# Patient Record
Sex: Male | Born: 1990 | Race: White | Hispanic: No | Marital: Married
Health system: Southern US, Community
[De-identification: ages and names within clinical notes are randomized; demographics above are authoritative.]

## PROBLEM LIST (undated history)

## (undated) DIAGNOSIS — I319 Disease of pericardium, unspecified: Secondary | ICD-10-CM

---

## 2009-09-12 ENCOUNTER — Encounter: Payer: Self-pay | Admitting: Internal Medicine

## 2009-09-12 ENCOUNTER — Observation Stay (HOSPITAL_COMMUNITY): Admission: EM | Admit: 2009-09-12 | Discharge: 2009-09-14 | Payer: Self-pay | Admitting: Emergency Medicine

## 2009-09-12 ENCOUNTER — Ambulatory Visit: Payer: Self-pay | Admitting: Internal Medicine

## 2009-09-24 ENCOUNTER — Encounter: Payer: Self-pay | Admitting: Internal Medicine

## 2009-10-20 ENCOUNTER — Ambulatory Visit: Payer: Self-pay | Admitting: Internal Medicine

## 2009-10-20 DIAGNOSIS — I319 Disease of pericardium, unspecified: Secondary | ICD-10-CM | POA: Insufficient documentation

## 2009-12-24 ENCOUNTER — Ambulatory Visit: Payer: Self-pay | Admitting: Internal Medicine

## 2010-04-21 NOTE — Assessment & Plan Note (Signed)
Summary: 2 [email or web address]   Visit Type:  Follow-up Primary Provider:  None  CC:  Pt continues to have problems with his throat otherwise he is doing ok.  History of Present Illness: [name] returns today for followup.   He is a pleasant 20 yo man with a h/io acute pericarditis diagnosed back in June 2011.  He was placed on ibuprofen and colchicine and returns today for followup.  I saw him several months ago and he had prematurely stopped his meds and his symptoms returned.  He has been back on his meds and has pain chest pain free.  He does note the sensation of a sore throat and pain when he swallows, particularly with spicey foods or soda's.  No other complaints.  Allergies: No Known Drug Allergies  Past History:  Past Medical History: Last updated: 10/20/2009 Current Problems:  ? GASTRITIS (ICD-535.50) PERICARDITIS (ICD-423.9)    Review of Systems  The patient denies hoarseness.    Vital Signs:  Patient profile:   20 year old male Height:      69 inches Weight:      160 pounds BMI:     23.71 Pulse rate:   76 / minute BP sitting:   126 / 67  (left arm) Cuff size:   regular  Vitals Entered By: Burnett Kanaris, CNA (December 24, 2009 11:23 AM)  Physical Exam  General:  Well developed, well nourished, in no acute distress.  HEENT: normal Neck: supple. No JVD. Carotids 2+ bilaterally no bruits Cor: RRR no gallops or murmur. No rubs are present today. Lungs: CTA Ab: soft, nontender. nondistended. No HSM. Good bowel sounds Ext: warm. no cyanosis, clubbing or edema Neuro: alert and oriented. Grossly nonfocal. affect pleasant    Impression & Recommendations:  Problem # 1:  PERICARDITIS (ICD-423.9) His symptoms have resolved.  I have asked him to stop his Ibuprofen today and continue colchicine.  I wll see him back in several months.  He is instructed to call if he has recurrent chest pain. His updated medication list for this problem includes:    Ibuprofen 200 Mg  Tabs (Ibuprofen) .Marland Kitchen... 2 tablets once daily  Problem # 2:  ? GASTRITIS (ICD-535.50) It is unclear if he has gastritis.  He will continue omeprazole and I suspect his symptoms are related to ibuprofen.  We have now stopped this medication.  Will followup. If his gastritis symptoms of sore throat and dysphagia do not resolve in a month, I will refer him to either ENT or GI.  Other Orders: EKG w/ Interpretation (93000)  Patient Instructions: 1)  Your physician recommends that you schedule a follow-up appointment in: 6 months 2)  Your physician has recommended you make the following change in your medication: Stop Ibuprofen.  Prescriptions: COLCRYS 0.6 MG TABS (COLCHICINE) Take one tablet by mouth twice daily.  #90 x 1   Entered by:   Claris Gladden RN   Authorized by:   Laren Boom, MD, [facility]   Signed by:   Claris Gladden RN on 12/24/2009   Method used:   Electronically to        [facility] [phone]* (retail)       [address]       [place], [place]  [address]       Ph: [phone]       Fax: [phone]   RxID:   [id number]

## 2010-04-21 NOTE — Assessment & Plan Note (Signed)
Summary: 4-6 week hospital follow up/mt   Visit Type:  Initial Consult Primary Provider:  None   History of Present Illness: Tony Aguilar returns today for followup.  He is a pleasant 20 yo man with acute pericarditis diagnosed several weeks ago.  He was started on his motrin and colchicine and improved.  He had done well until several days ago when he stopped taking his meds and has noted some recurrent symptoms which are not as bad as prior.  He has had no fever/chills/or sob.  Current Medications (verified): 1)  Colcrys 0.6 Mg Tabs (Colchicine) .... Take One Tablet By Mouth Twice Daily. 2)  Prilosec Otc 20 Mg Tbec (Omeprazole Magnesium) .... Once Daily 3)  Ibuprofen 200 Mg Tabs (Ibuprofen) .... 2 Tablets Once Daily  Allergies (verified): No Known Drug Allergies  Past History:  Past Medical History: Last updated: 10/20/2009 Current Problems:  ? GASTRITIS (ICD-535.50) PERICARDITIS (ICD-423.9)    Family History: Last updated: 10/20/2009  Mother in good health.  Father with diabetes.  He has   brothers and sisters in good health.   Social History: Last updated: 10/20/2009  He lives in [place] with his mother.  He is a Teaching laboratory technician.  He is single with no children.  He does   not smoke.  He drinks occasionally.  He is negative for drug use.      Review of Systems       All systems reviewed and negative except as noted above.  Vital Signs:  Patient profile:   20 year old male Weight:      162 pounds Pulse rate:   63 / minute BP sitting:   120 / 82  (left arm)  Vitals Entered By: Laurance Flatten CMA (October 20, 2009 1:57 PM)  Physical Exam  General:  Well developed, well nourished, in no acute distress.  HEENT: normal Neck: supple. No JVD. Carotids 2+ bilaterally no bruits Cor: RRR no gallops or murmur. A soft friction rub is present (2 component) at the apex. Lungs: CTA Ab: soft, nontender. nondistended. No HSM. Good bowel sounds Ext: warm.  no cyanosis, clubbing or edema Neuro: alert and oriented. Grossly nonfocal. affect pleasant    Impression & Recommendations:  Problem # 1:  PERICARDITIS (ICD-423.9) I have asked him to restart his ibuprofen and colchicine.  I plan to continue his meds for another two months and continue colchicine after this. His updated medication list for this problem includes:    Ibuprofen 200 Mg Tabs (Ibuprofen) .Marland Kitchen... 2 tablets once daily  Patient Instructions: 1)  Your physician recommends that you schedule a follow-up appointment in: 2 months. 2)  Your physician recommends that you continue on your current medications as directed. Please refer to the Current Medication list given to you today.

## 2010-04-21 NOTE — Letter (Signed)
Summary: [facility]   Imported By: Marylou Mccoy 11/17/2009 10:23:43  _____________________________________________________________________  External Attachment:    Type:   Image     Comment:   External Document

## 2010-06-07 LAB — CBC
HCT: 43.2 % (ref 39.0–52.0)
Hemoglobin: 14.7 g/dL (ref 13.0–17.0)
MCH: 32.1 pg (ref 26.0–34.0)
MCV: 94.6 fL (ref 78.0–100.0)
RDW: 12.4 % (ref 11.5–15.5)

## 2010-06-07 LAB — URINALYSIS, ROUTINE W REFLEX MICROSCOPIC
Bilirubin Urine: NEGATIVE
Glucose, UA: NEGATIVE mg/dL
Protein, ur: NEGATIVE mg/dL
Specific Gravity, Urine: 1.01 (ref 1.005–1.030)
Urobilinogen, UA: 1 mg/dL (ref 0.0–1.0)
pH: 6 (ref 5.0–8.0)

## 2010-06-07 LAB — DIFFERENTIAL
Basophils Absolute: 0 10*3/uL (ref 0.0–0.1)
Basophils Relative: 0 % (ref 0–1)
Eosinophils Relative: 1 % (ref 0–5)
Lymphocytes Relative: 8 % — ABNORMAL LOW (ref 12–46)
Monocytes Absolute: 1.7 10*3/uL — ABNORMAL HIGH (ref 0.1–1.0)
Neutro Abs: 8.9 10*3/uL — ABNORMAL HIGH (ref 1.7–7.7)

## 2010-06-07 LAB — BASIC METABOLIC PANEL
BUN: 7 mg/dL (ref 6–23)
CO2: 29 mEq/L (ref 19–32)
Calcium: 9.1 mg/dL (ref 8.4–10.5)
Chloride: 104 mEq/L (ref 96–112)
Chloride: 108 mEq/L (ref 96–112)
Creatinine, Ser: 0.9 mg/dL (ref 0.4–1.5)
GFR calc Af Amer: >60 mL/min (ref >60)
GFR calc non Af Amer: >60 mL/min (ref >60)
Glucose, Bld: 95 mg/dL (ref 70–99)
Glucose, Bld: 98 mg/dL (ref 70–99)
Sodium: 144 mEq/L (ref 135–145)

## 2010-06-07 LAB — URINE MICROSCOPIC-ADD ON

## 2010-06-07 LAB — POCT CARDIAC MARKERS: CKMB, poc: 18.1 ng/mL (ref 1.0–8.0)

## 2010-06-07 LAB — RAPID URINE DRUG SCREEN, HOSP PERFORMED
Benzodiazepines: NOT DETECTED
Cocaine: NOT DETECTED
Tetrahydrocannabinol: NOT DETECTED

## 2010-06-07 LAB — CARDIAC PANEL(CRET KIN+CKTOT+MB+TROPI)
CK, MB: 11 ng/mL (ref 0.3–4.0)
Relative Index: 4.7 — ABNORMAL HIGH (ref 0.0–2.5)
Relative Index: 5.4 — ABNORMAL HIGH (ref 0.0–2.5)
Total CK: 144 U/L (ref 7–232)
Troponin I: 1.53 ng/mL (ref 0.00–0.06)

## 2010-06-07 LAB — CK TOTAL AND CKMB (NOT AT ARMC): Relative Index: 8.9 — ABNORMAL HIGH (ref 0.0–2.5)

## 2015-08-13 ENCOUNTER — Encounter (HOSPITAL_COMMUNITY): Payer: Self-pay | Admitting: Adult Health

## 2015-08-13 ENCOUNTER — Emergency Department (HOSPITAL_COMMUNITY)
Admission: EM | Admit: 2015-08-13 | Discharge: 2015-08-13 | Disposition: A | Discharge status: Home / Self Care | Payer: 59 | Attending: Emergency Medicine | Admitting: Emergency Medicine

## 2015-08-13 ENCOUNTER — Emergency Department (HOSPITAL_COMMUNITY): Payer: 59

## 2015-08-13 DIAGNOSIS — Z8679 Personal history of other diseases of the circulatory system: Secondary | ICD-10-CM | POA: Diagnosis not present

## 2015-08-13 DIAGNOSIS — R0602 Shortness of breath: Secondary | ICD-10-CM | POA: Diagnosis present

## 2015-08-13 DIAGNOSIS — R200 Anesthesia of skin: Secondary | ICD-10-CM | POA: Diagnosis not present

## 2015-08-13 DIAGNOSIS — R55 Syncope and collapse: Secondary | ICD-10-CM | POA: Insufficient documentation

## 2015-08-13 DIAGNOSIS — J069 Acute upper respiratory infection, unspecified: Secondary | ICD-10-CM | POA: Diagnosis not present

## 2015-08-13 HISTORY — DX: Disease of pericardium, unspecified: I31.9

## 2015-08-13 LAB — CBC
HCT: 41.1 % (ref 39.0–52.0)
HEMOGLOBIN: 14 g/dL (ref 13.0–17.0)
MCH: 31.3 pg (ref 26.0–34.0)
MCHC: 34.1 g/dL (ref 30.0–36.0)
MCV: 91.7 fL (ref 78.0–100.0)
Platelets: 291 10*3/uL (ref 150–400)
RBC: 4.48 MIL/uL (ref 4.22–5.81)
RDW: 12.4 % (ref 11.5–15.5)
WBC: 9.9 10*3/uL (ref 4.0–10.5)

## 2015-08-13 LAB — BASIC METABOLIC PANEL
ANION GAP: 8 (ref 5–15)
BUN: 7 mg/dL (ref 6–20)
CALCIUM: 9.4 mg/dL (ref 8.9–10.3)
CHLORIDE: 102 mmol/L (ref 101–111)
CO2: 28 mmol/L (ref 22–32)
Creatinine, Ser: 0.9 mg/dL (ref 0.61–1.24)
GFR calc non Af Amer: >60 mL/min (ref >60)
Glucose, Bld: 93 mg/dL (ref 65–99)
Potassium: 3.8 mmol/L (ref 3.5–5.1)
SODIUM: 138 mmol/L (ref 135–145)

## 2015-08-13 MED ORDER — AZITHROMYCIN 250 MG PO TABS: 250.0000 mg | ORAL_TABLET | Freq: Every day | ORAL | Status: AC

## 2015-08-13 MED ORDER — AZITHROMYCIN 250 MG PO TABS
500.0000 mg | ORAL_TABLET | Freq: Once | ORAL | Status: AC
  Administered 2015-08-13: 500 mg via ORAL
  Filled 2015-08-13: qty 2

## 2015-08-13 MED ORDER — AZITHROMYCIN 250 MG PO TABS: 250.0000 mg | ORAL_TABLET | Freq: Every day | ORAL | Status: DC

## 2015-08-13 NOTE — ED Notes (Signed)
Presents with onset of "I was at work and I was lifting something and felt kind of SOB, I took a break and about 15 minutes later I felt like I was light headed and both my arms went numb below my elbows" pt denies pain, alert and orietend at this time. SOB is resolved.

## 2015-08-13 NOTE — ED Provider Notes (Signed)
CSN: [id number]     Arrival date & time 08/13/15  0116 History   First MD Initiated Contact with Patient 08/13/15 0327     Chief Complaint  Patient presents with  . Near Syncope     (Consider location/radiation/quality/duration/timing/severity/associated sxs/prior Treatment) HPI   Patient to the ER with PMH of pericarditis with complaints of shortness of breath and having a limited sensation of numbness to bilateral arms from the elbow down.   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 He did not eat or drink less than usual and did not do more physical activity than he is used to. He started to get winded and then 15 minutes later reports feeling lightheaded, his arms felt numb. He at no time had any pain. The shortness of breath resolved on its own. His vital signs are stable.  He says that he has been sick the past two months with cough, nasal congestion, runny nose and not feeling well. He has not yet been seen or treated for these symptoms.  Past Medical History  Diagnosis Date  . Pericarditis    History reviewed. No pertinent past surgical history. History reviewed. No pertinent family history. Social History  Substance Use Topics  . Smoking status: Never Smoker   . Smokeless tobacco: None  . Alcohol Use: Yes    Review of Systems  Review of Systems All other systems negative except as documented in the HPI. All pertinent positives and negatives as reviewed in the HPI.   Allergies  Review of patient's allergies indicates no known allergies.  Home Medications   Prior to Admission medications   Medication Sig Start Date End Date Taking? Authorizing Provider  guaiFENesin (MUCINEX) 600 MG 12 hr tablet Take 600 mg by mouth 2 (two) times daily as needed for cough.   Yes Historical Provider, MD  azithromycin (ZITHROMAX) 250 MG tablet Take 1 tablet (250 mg total) by mouth daily. Take first 2 tablets together, then 1 every day until finished.  08/13/15   Gates Jividen Neva SeatGreene, PA-C   BP 118/79 mmHg  Pulse 61  Temp(Src) 97.5 F (36.4 C) (Oral)  Resp 12  SpO2 100% Physical Exam  Constitutional: He appears well-developed and well-nourished. No distress.  HENT:  Head: Normocephalic and atraumatic.  Right Ear: Tympanic membrane and ear canal normal.  Left Ear: Tympanic membrane and ear canal normal.  Nose: Nose normal.  Mouth/Throat: Uvula is midline, oropharynx is clear and moist and mucous membranes are normal.  Eyes: Pupils are equal, round, and reactive to light.  Neck: Normal range of motion. Neck supple.  Cardiovascular: Normal rate and regular rhythm.   Pulmonary/Chest: Effort normal.  Abdominal: Soft.  No signs of abdominal distention  Musculoskeletal:  No LE swelling Physiologic grip strength and sensations intact Radial pulses are symmetrical   Neurological: He is alert.  Acting at baseline  Skin: Skin is warm and dry. No rash noted.  Nursing note and vitals reviewed.   ED Course  Procedures (including critical care time) Labs Review Labs Reviewed  BASIC METABOLIC PANEL  CBC    Imaging Review Dg Chest 2 View  08/13/2015  CLINICAL DATA:  Initial evaluation for acute cough, shortness of breath, fatigue. Numbness in both arms. EXAM: CHEST  2 VIEW COMPARISON:  Prior study from 09/12/2009. FINDINGS: The cardiac and mediastinal silhouettes are stable in size and contour, and remain within normal limits. The lungs are normally inflated. No airspace consolidation, pleural effusion, or pulmonary  edema is identified. There is no pneumothorax. No acute osseous abnormality identified. IMPRESSION: No active cardiopulmonary disease. Electronically Signed   By: Rise Mu M.D.   On: 08/13/2015 04:41   I have personally reviewed and evaluated these images and lab results as part of my medical decision-making.  MANLY, NESTLE [id number] 13-Aug-2015 01:27:10 [facility]/ED ROUTINE RECORD Normal sinus  rhythm with sinus arrhythmia Normal ECG 64mm/s 54mm/mV  8.0 SP2 12SL 237 CID: 6 Referred by: Unconfirmed Vent. rate 77 BPM PR interval 132 ms QRS duration 86 ms QT/QTc 390/441 ms P-R-T axes 71 74 59    MDM   Final diagnoses:  URI (upper respiratory infection)    Pt is not orthostatic, chest xray and EKG are unremarkable. CBC and BMP are unremarkable.  Filed Vitals:   08/13/15 0345 08/13/15 0400  BP: 121/76 118/79  Pulse: 55 61  Temp:    Resp: 21 12    Medications  azithromycin (ZITHROMAX) tablet 500 mg (not administered)    Pt symptoms consistent with URI. CXR negative for acute infiltrate. Patient given reassurance and return precautions. Pt will be discharged with symptomatic treatment.  Discussed return precautions.  Pt is hemodynamically stable & in NAD prior to discharge.     Marlon Pel, PA-C 08/13/15 2956  Derwood Kaplan, MD 08/13/15 [phone]

## 2015-08-13 NOTE — Discharge Instructions (Signed)
Upper Respiratory Infection, Adult Most upper respiratory infections (URIs) are a viral infection of the air passages leading to the lungs. A URI affects the nose, throat, and upper air passages. The most common type of URI is nasopharyngitis and is typically referred to as "the common cold." URIs run their course and usually go away on their own. Most of the time, a URI does not require medical attention, but sometimes a bacterial infection in the upper airways can follow a viral infection. This is called a secondary infection. Sinus and middle ear infections are common types of secondary upper respiratory infections. Bacterial pneumonia can also complicate a URI. A URI can worsen asthma and chronic obstructive pulmonary disease (COPD). Sometimes, these complications can require emergency medical care and may be life threatening.  CAUSES Almost all URIs are caused by viruses. A virus is a type of germ and can spread from one person to another.  RISKS FACTORS You may be at risk for a URI if:   You smoke.   You have chronic heart or lung disease.  You have a weakened defense (immune) system.   You are very young or very old.   You have nasal allergies or asthma.  You work in crowded or poorly ventilated areas.  You work in health care facilities or schools. SIGNS AND SYMPTOMS  Symptoms typically develop 2-3 days after you come in contact with a cold virus. Most viral URIs last 7-10 days. However, viral URIs from the influenza virus (flu virus) can last 14-18 days and are typically more severe. Symptoms may include:   Runny or stuffy (congested) nose.   Sneezing.   Cough.   Sore throat.   Headache.   Fatigue.   Fever.   Loss of appetite.   Pain in your forehead, behind your eyes, and over your cheekbones (sinus pain).  Muscle aches.  DIAGNOSIS  Your health care provider may diagnose a URI by:  Physical exam.  Tests to check that your symptoms are not due to  another condition such as:  Strep throat.  Sinusitis.  Pneumonia.  Asthma. TREATMENT  A URI goes away on its own with time. It cannot be cured with medicines, but medicines may be prescribed or recommended to relieve symptoms. Medicines may help:  Reduce your fever.  Reduce your cough.  Relieve nasal congestion. HOME CARE INSTRUCTIONS   Take medicines only as directed by your health care provider.   Gargle warm saltwater or take cough drops to comfort your throat as directed by your health care provider.  Use a warm mist humidifier or inhale steam from a shower to increase air moisture. This may make it easier to breathe.  Drink enough fluid to keep your urine clear or pale yellow.   Eat soups and other clear broths and maintain good nutrition.   Rest as needed.   Return to work when your temperature has returned to normal or as your health care provider advises. You may need to stay home longer to avoid infecting others. You can also use a face mask and careful hand washing to prevent spread of the virus.  Increase the usage of your inhaler if you have asthma.   Do not use any tobacco products, including cigarettes, chewing tobacco, or electronic cigarettes. If you need help quitting, ask your health care provider. PREVENTION  The best way to protect yourself from getting a cold is to practice good hygiene.   Avoid oral or hand contact with people with cold   symptoms.   Wash your hands often if contact occurs.  There is no clear evidence that vitamin C, vitamin E, echinacea, or exercise reduces the chance of developing a cold. However, it is always recommended to get plenty of rest, exercise, and practice good nutrition.  SEEK MEDICAL CARE IF:   You are getting worse rather than better.   Your symptoms are not controlled by medicine.   You have chills.  You have worsening shortness of breath.  You have brown or red mucus.  You have yellow or brown nasal  discharge.  You have pain in your face, especially when you bend forward.  You have a fever.  You have swollen neck glands.  You have pain while swallowing.  You have white areas in the back of your throat. SEEK IMMEDIATE MEDICAL CARE IF:   You have severe or persistent:  Headache.  Ear pain.  Sinus pain.  Chest pain.  You have chronic lung disease and any of the following:  Wheezing.  Prolonged cough.  Coughing up blood.  A change in your usual mucus.  You have a stiff neck.  You have changes in your:  Vision.  Hearing.  Thinking.  Mood. MAKE SURE YOU:   Understand these instructions.  Will watch your condition.  Will get help right away if you are not doing well or get worse.   This information is not intended to replace advice given to you by your health care provider. Make sure you discuss any questions you have with your health care provider.   Document Released: 09/01/2000 Document Revised: 07/23/2014 Document Reviewed: 06/13/2013 Elsevier Interactive Patient Education 2016 [employer] Inc.  

## 2015-08-22 ENCOUNTER — Encounter (HOSPITAL_COMMUNITY): Payer: Self-pay

## 2015-08-22 ENCOUNTER — Ambulatory Visit (HOSPITAL_COMMUNITY)
Admission: EM | Admit: 2015-08-22 | Discharge: 2015-08-22 | Disposition: A | Discharge status: Home / Self Care | Payer: 59 | Attending: Emergency Medicine | Admitting: Emergency Medicine

## 2015-08-22 DIAGNOSIS — R7302 Impaired glucose tolerance (oral): Secondary | ICD-10-CM | POA: Diagnosis not present

## 2015-08-22 LAB — POCT I-STAT, CHEM 8
BUN: 10 mg/dL (ref 6–20)
Calcium, Ion: 1.23 mmol/L (ref 1.12–1.23)
Chloride: 103 mmol/L (ref 101–111)
Creatinine, Ser: 0.9 mg/dL (ref 0.61–1.24)
GLUCOSE: 95 mg/dL (ref 65–99)
HEMATOCRIT: 44 % (ref 39.0–52.0)
HEMOGLOBIN: 15 g/dL (ref 13.0–17.0)
POTASSIUM: 3.9 mmol/L (ref 3.5–5.1)
Sodium: 143 mmol/L (ref 135–145)
TCO2: 27 mmol/L (ref 0–100)

## 2015-08-22 NOTE — ED Provider Notes (Signed)
CSN: [id number]     Arrival date & time 08/22/15  1559 History   First MD Initiated Contact with Patient 08/22/15 1614     Chief Complaint  Patient presents with  . Hyperglycemia   (Consider location/radiation/quality/duration/timing/severity/associated sxs/prior Treatment) HPI  He is a 25 year old man here for evaluation of hyperglycemia. He states that earlier today his friend let him check his blood sugar with his meter and it was elevated at 232. Patient states he checked his sugar 30 minutes after eating a hamburger and some sort of pecan pie sort of dessert.  He does report some intermittent increased thirst and increased urination, but nothing consistent. No pain or fevers. Diabetes does run in his family.  Past Medical History  Diagnosis Date  . Pericarditis    History reviewed. No pertinent past surgical history. No family history on file. Social History  Substance Use Topics  . Smoking status: Never Smoker   . Smokeless tobacco: Never Used  . Alcohol Use: 5.4 oz/week    9 Cans of beer per week    Review of Systems As in history of present illness Allergies  Review of patient's allergies indicates no known allergies.  Home Medications   Prior to Admission medications   Medication Sig Start Date End Date Taking? Authorizing Provider  azithromycin (ZITHROMAX) 250 MG tablet Take 1 tablet (250 mg total) by mouth daily. Take first 2 tablets together, then 1 every day until finished. 08/13/15   Tiffany Neva SeatGreene, PA-C  guaiFENesin (MUCINEX) 600 MG 12 hr tablet Take 600 mg by mouth 2 (two) times daily as needed for cough.    Historical Provider, MD   Meds Ordered and Administered this Visit  Medications - No data to display  BP 118/71 mmHg  Pulse 75  Temp(Src) 98.7 F (37.1 C) (Oral)  SpO2 98% No data found.   Physical Exam  Constitutional: He is oriented to person, place, and time. He appears well-developed and well-nourished. No distress.  Cardiovascular: Normal rate,  regular rhythm and normal heart sounds.   No murmur heard. Pulmonary/Chest: Effort normal and breath sounds normal. No respiratory distress. He has no wheezes. He has no rales.  Neurological: He is alert and oriented to person, place, and time.    ED Course  Procedures (including critical care time)  Labs Review Labs Reviewed  POCT I-STAT, CHEM 8  Glucose 95 on i-stat  He was seen in the emergency room on May 24 and a glucose done at that time was 93.  Imaging Review No results found.   MDM   1. Glucose intolerance (impaired glucose tolerance)    Discussed with patient that he may have some degree of glucose intolerance. He does have an appointment coming up to establish care with a PCP. He will request diabetic screening at that time. I recommended that he avoid soda, cookies, and simple carbohydrates.    Charm RingsErin J Sandip Power, MD 08/22/15 1655

## 2015-08-22 NOTE — Discharge Instructions (Signed)
Your blood sugar here is 95. It is normal for your blood sugar to go up after eating, particularly fast food and sugary foods.   Based on your blood sugar after eating, you may have some glucose intolerance. When you see your new doctor, they can do some additional screening for diabetes. In the meantime, stay away from sodas, cookies, and simple carbohydrates such as pasta and white bread.

## 2015-08-22 NOTE — ED Notes (Signed)
Patient states that he had his blood sugar tested this morning at a friend's house and it read 232 and pt is concerned about having diabetes and would like to be tested, pt states he is having frequent urination and fatigue. No acute distress

## 2017-04-06 DIAGNOSIS — Z719 Counseling, unspecified: Secondary | ICD-10-CM | POA: Diagnosis not present

## 2017-06-08 IMAGING — DX DG CHEST 2V
2 series · 2 of 2 positions shown · non-contrast
Comparison: Prior study from 09/12/2009.

CLINICAL DATA: Initial evaluation for acute cough, shortness of
breath, fatigue. Numbness in both arms.

EXAM:
CHEST  2 VIEW

[chest pa]
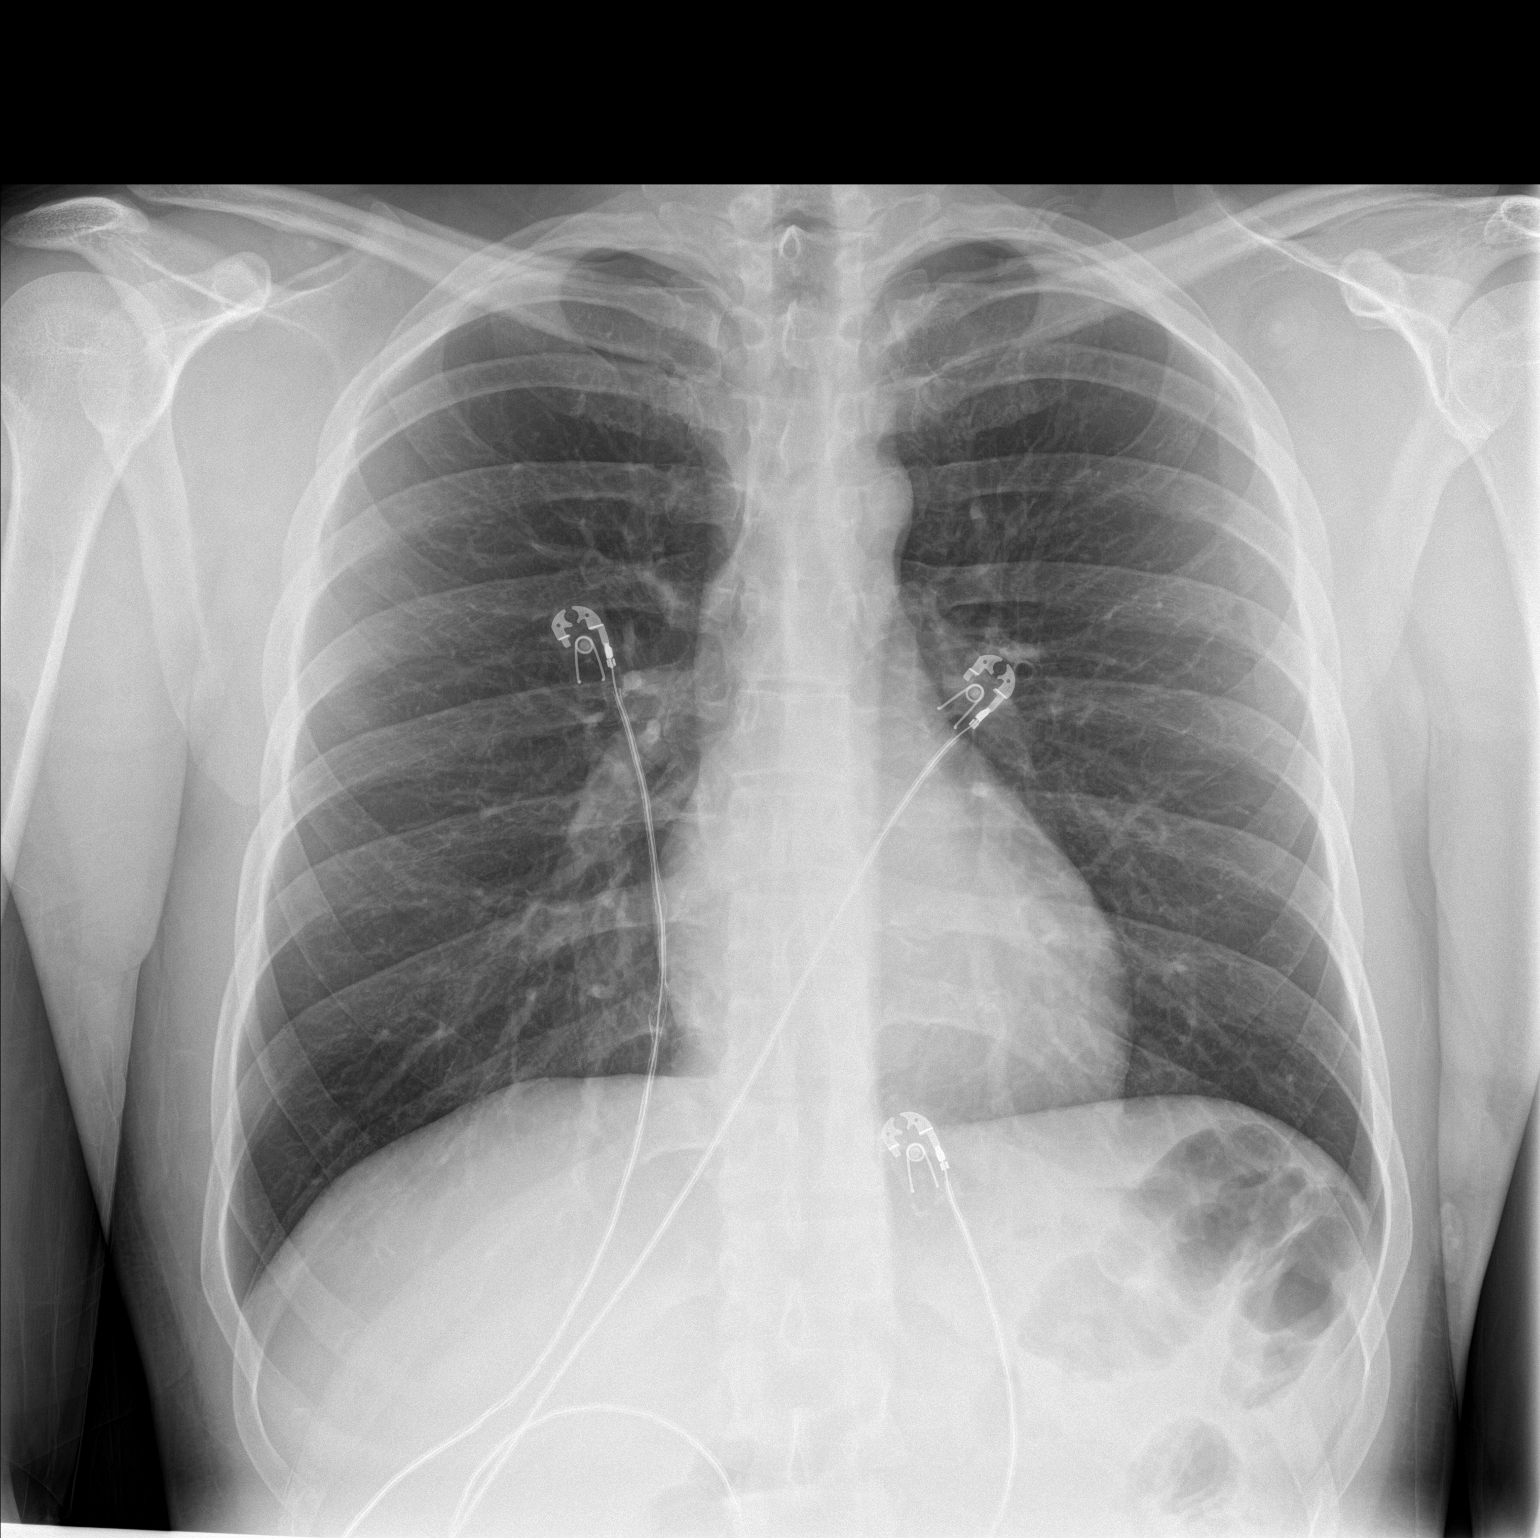

[chest lat]
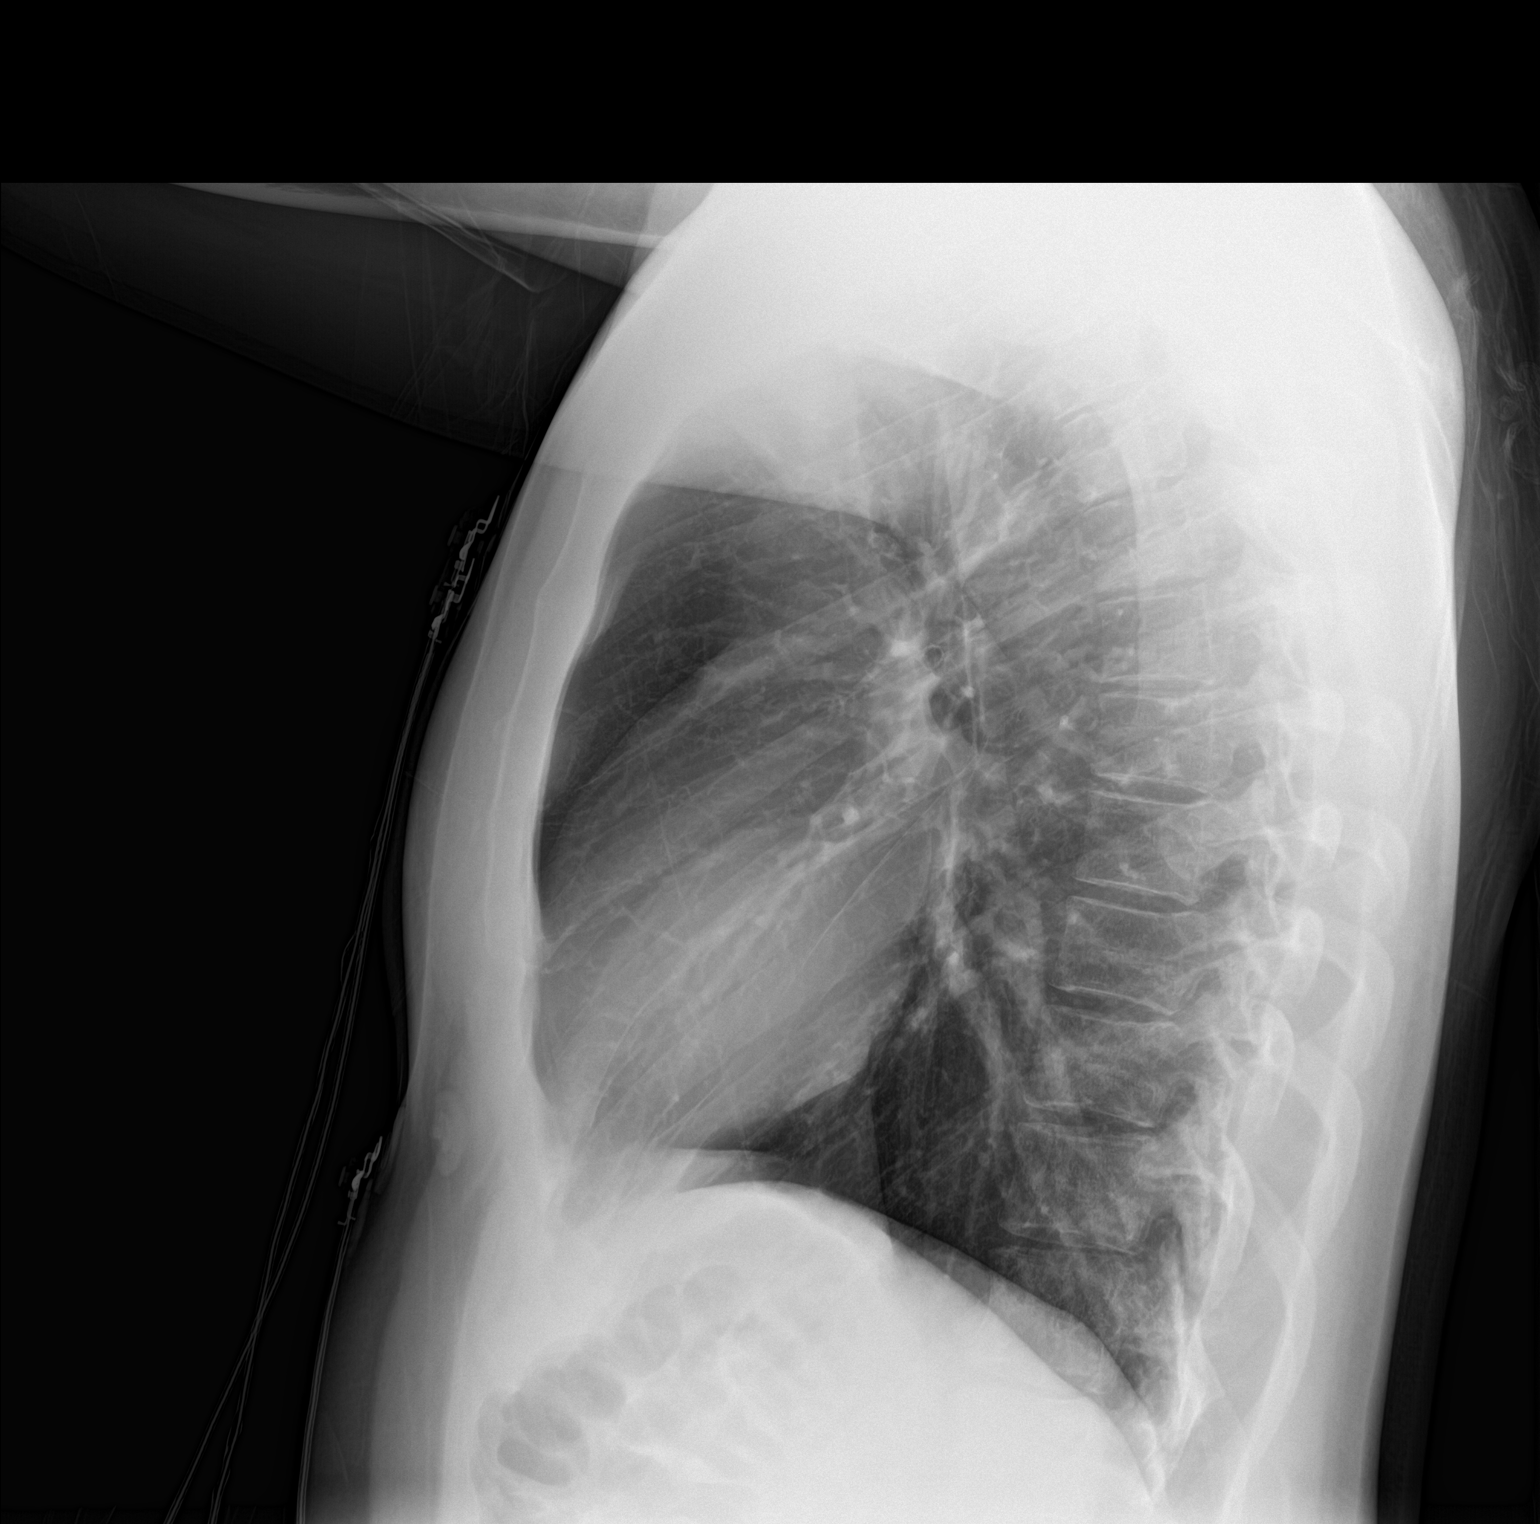

[2 of 2 positions shown; findings below may reference images not displayed]

FINDINGS: The cardiac and mediastinal silhouettes are stable in size and
contour, and remain within normal limits.

The lungs are normally inflated. No airspace consolidation, pleural
effusion, or pulmonary edema is identified. There is no
pneumothorax.

No acute osseous abnormality identified.
IMPRESSION: No active cardiopulmonary disease.

## 2017-10-11 DIAGNOSIS — Z833 Family history of diabetes mellitus: Secondary | ICD-10-CM | POA: Diagnosis not present

## 2017-10-11 DIAGNOSIS — Z13220 Encounter for screening for lipoid disorders: Secondary | ICD-10-CM | POA: Diagnosis not present
